# Patient Record
Sex: Female | Born: 1995 | Hispanic: Yes | Marital: Married | Smoking: Never smoker
Health system: Southern US, Community
[De-identification: ages and names within clinical notes are randomized; demographics above are authoritative.]

## PROBLEM LIST (undated history)

## (undated) ENCOUNTER — Inpatient Hospital Stay: Payer: Self-pay

---

## 2017-11-18 ENCOUNTER — Emergency Department: Payer: Medicaid Other

## 2017-11-18 ENCOUNTER — Emergency Department
Admission: EM | Admit: 2017-11-18 | Discharge: 2017-11-18 | Disposition: A | Discharge status: Home / Self Care | Payer: Medicaid Other | Attending: Emergency Medicine | Admitting: Emergency Medicine

## 2017-11-18 DIAGNOSIS — R1030 Lower abdominal pain, unspecified: Secondary | ICD-10-CM | POA: Insufficient documentation

## 2017-11-18 DIAGNOSIS — O9989 Other specified diseases and conditions complicating pregnancy, childbirth and the puerperium: Secondary | ICD-10-CM | POA: Diagnosis present

## 2017-11-18 DIAGNOSIS — R109 Unspecified abdominal pain: Secondary | ICD-10-CM

## 2017-11-18 DIAGNOSIS — O26891 Other specified pregnancy related conditions, first trimester: Secondary | ICD-10-CM

## 2017-11-18 LAB — COMPREHENSIVE METABOLIC PANEL
ALT: 18 U/L (ref 14–54)
ANION GAP: 7 (ref 5–15)
AST: 19 U/L (ref 15–41)
Albumin: 3.6 g/dL (ref 3.5–5.0)
Alkaline Phosphatase: 53 U/L (ref 38–126)
BILIRUBIN TOTAL: 0.3 mg/dL (ref 0.3–1.2)
BUN: 10 mg/dL (ref 6–20)
CHLORIDE: 108 mmol/L (ref 101–111)
CO2: 22 mmol/L (ref 22–32)
Calcium: 8.8 mg/dL — ABNORMAL LOW (ref 8.9–10.3)
Creatinine, Ser: 0.44 mg/dL (ref 0.44–1.00)
Glucose, Bld: 89 mg/dL (ref 65–99)
POTASSIUM: 3.3 mmol/L — AB (ref 3.5–5.1)
Sodium: 137 mmol/L (ref 135–145)
TOTAL PROTEIN: 7 g/dL (ref 6.5–8.1)

## 2017-11-18 LAB — URINALYSIS, COMPLETE (UACMP) WITH MICROSCOPIC
BACTERIA UA: NONE SEEN
Bilirubin Urine: NEGATIVE
Glucose, UA: NEGATIVE mg/dL
Hgb urine dipstick: NEGATIVE
KETONES UR: NEGATIVE mg/dL
LEUKOCYTES UA: NEGATIVE
Nitrite: NEGATIVE
PH: 5 (ref 5.0–8.0)
PROTEIN: NEGATIVE mg/dL
Specific Gravity, Urine: 1.015 (ref 1.005–1.030)

## 2017-11-18 LAB — CBC
HEMATOCRIT: 40.9 % (ref 35.0–47.0)
HEMOGLOBIN: 13.7 g/dL (ref 12.0–16.0)
MCH: 28.3 pg (ref 26.0–34.0)
MCHC: 33.5 g/dL (ref 32.0–36.0)
MCV: 84.5 fL (ref 80.0–100.0)
Platelets: 242 10*3/uL (ref 150–440)
RBC: 4.85 MIL/uL (ref 3.80–5.20)
RDW: 13.2 % (ref 11.5–14.5)
WBC: 11.8 10*3/uL — AB (ref 3.6–11.0)

## 2017-11-18 LAB — WET PREP, GENITAL
SPERM: NONE SEEN
TRICH WET PREP: NONE SEEN
YEAST WET PREP: NONE SEEN

## 2017-11-18 LAB — HCG, QUANTITATIVE, PREGNANCY: hCG, Beta Chain, Quant, S: 89515 m[IU]/mL — ABNORMAL HIGH (ref <5)

## 2017-11-18 LAB — ABO/RH: ABO/RH(D): A POS

## 2017-11-18 LAB — TROPONIN I: Troponin I: <0.03 ng/mL (ref <0.03)

## 2017-11-18 LAB — POCT PREGNANCY, URINE: PREG TEST UR: POSITIVE — AB

## 2017-11-18 LAB — LIPASE, BLOOD: LIPASE: 26 U/L (ref 11–51)

## 2017-11-18 MED ORDER — METRONIDAZOLE 0.75 % VA GEL
1.0000 | Freq: Every day | VAGINAL | Status: DC
  Filled 2017-11-18: qty 70

## 2017-11-18 MED ORDER — METRONIDAZOLE 0.75 % VA GEL: 1.0000 | Freq: Every day | VAGINAL | 0 refills | Status: AC

## 2017-11-18 NOTE — ED Provider Notes (Addendum)
[facility] Emergency Department Provider Note  ____________________________________________   I have reviewed the triage vital signs and the nursing notes. Where available I have reviewed prior notes and, if possible and indicated, outside hospital notes.    HISTORY  Chief Complaint Abdominal Pain    HPI Sonya Bruce is a 22 y.o. female   who is G2P1, states that she had a positive patency test today.  Patient states that she has had bilateral and suprapubic cramping discomfort for several weeks.  She states that it is sometimes worse and sometimes better but she cannot predict Y.  She denies to me any upper quadrant abdominal pain.  The pain is all suprapubic.  When she found she was pregnant she came in for further evaluation.  She denies any vaginal bleeding, she has a slight normal physiologic vaginal discharge she states.  She denies any diarrhea.  The pain seems to come and go.  She is not having it at this time.  She is concerned about the pregnancy.  She denies any fever chills, nausea or vomiting or any other concerns.  She has no dysuria, She is unclear on her dates, she has a regular.  She thinks she last had a period in January.   History reviewed. No pertinent past medical history.  There are no active problems to display for this patient.   History reviewed. No pertinent surgical history.  Prior to Admission medications   Not on File    Allergies Aspirin  No family history on file.  Social History Social History   Tobacco Use  . Smoking status: Never Smoker  . Smokeless tobacco: Never Used  Substance Use Topics  . Alcohol use: Not Currently  . Drug use: Not on file    Review of Systems Constitutional: No fever/chills Eyes: No visual changes. ENT: No sore throat. No stiff neck no neck pain Cardiovascular: Denies chest pain. Respiratory: Denies shortness of breath. Gastrointestinal:   no vomiting.  No diarrhea.  No  constipation. Genitourinary: Negative for dysuria. Musculoskeletal: Negative lower extremity swelling Skin: Negative for rash. Neurological: Negative for severe headaches, focal weakness or numbness.   ____________________________________________   PHYSICAL EXAM:  VITAL SIGNS: ED Triage Vitals  Enc Vitals Group     BP 11/18/17 1944 109/60     Pulse Rate 11/18/17 1944 83     Resp 11/18/17 1944 17     Temp 11/18/17 1944 98 F (36.7 C)     Temp Source 11/18/17 1944 Oral     SpO2 11/18/17 1944 100 %     Weight 11/18/17 1945 147 lb (66.7 kg)     Height 11/18/17 1945 5\' 3"  (1.6 m)     Head Circumference --      Peak Flow --      Pain Score 11/18/17 1948 8     Pain Loc --      Pain Edu? --      Excl. in GC? --     Constitutional: Alert and oriented. Well appearing and in no acute distress. Eyes: Conjunctivae are normal Head: Atraumatic HEENT: No congestion/rhinnorhea. Mucous membranes are moist.  Oropharynx non-erythematous Neck:   Nontender with no meningismus, no masses, no stridor Cardiovascular: Normal rate, regular rhythm. Grossly normal heart sounds.  Good peripheral circulation. Respiratory: Normal respiratory effort.  No retractions. Lungs CTAB. Abdominal: Soft and nontender. No distention. No guarding no rebound Back:  There is no focal tenderness or step off.  there is no midline  tenderness there are no lesions noted. there is no CVA tenderness Musculoskeletal: No lower extremity tenderness, no upper extremity tenderness. No joint effusions, no DVT signs strong distal pulses no edema Neurologic:  Normal speech and language. No gross focal neurologic deficits are appreciated.  Skin:  Skin is warm, dry and intact. No rash noted. Psychiatric: Mood and affect are normal. Speech and behavior are normal.  ____________________________________________   LABS (all labs ordered are listed, but only abnormal results are displayed)  Labs Reviewed  COMPREHENSIVE METABOLIC  PANEL - Abnormal; Notable for the following components:      Result Value   Potassium 3.3 (*)    Calcium 8.8 (*)    All other components within normal limits  CBC - Abnormal; Notable for the following components:   WBC 11.8 (*)    All other components within normal limits  URINALYSIS, COMPLETE (UACMP) WITH MICROSCOPIC - Abnormal; Notable for the following components:   Color, Urine YELLOW (*)    APPearance CLEAR (*)    Squamous Epithelial / LPF 0-5 (*)    All other components within normal limits  POCT PREGNANCY, URINE - Abnormal; Notable for the following components:   Preg Test, Ur POSITIVE (*)    All other components within normal limits  CHLAMYDIA/NGC RT PCR ([facility] ONLY)  WET PREP, GENITAL  LIPASE, BLOOD  TROPONIN I  HCG, QUANTITATIVE, PREGNANCY  POC URINE PREG, ED  ABO/RH    Pertinent labs  results that were available during my care of the patient were reviewed by me and considered in my medical decision making (see chart for details). ____________________________________________  EKG  I personally interpreted any EKGs ordered by me or triage Normal sinus rhythm rate 68 bpm no acute ST elevation or depression normal axis ____________________________________________  RADIOLOGY  Pertinent labs & imaging results that were available during my care of the patient were reviewed by me and considered in my medical decision making (see chart for details). If possible, patient and/or family made aware of any abnormal findings.  No results found. ____________________________________________    PROCEDURES  Procedure(s) performed: None  Procedures  Critical Care performed: None  ____________________________________________   INITIAL IMPRESSION / ASSESSMENT AND PLAN / ED COURSE  Pertinent labs & imaging results that were available during my care of the patient were reviewed by me and considered in my medical decision making (see chart for details).  Pregnant  patient here with unclear dates who presents with pelvic pain off and on for several weeks 2-3.  Abdomen is benign at this time will obtain an ultrasound to rule out ectopic I will do a pelvic exam to rule out PID and we will reassess.  I do not think she likely has appendicitis given that she has no fever she has had symptoms for weeks and she has no reducible right lower quadrant pain, there is nothing at this time to suggest gallbladder disease she has no tenderness to palpation in the right upper quadrant, I do not think CT is indicated given her pregnancy state and lack of significant findings on exam or blood work.  If ultrasound is negative, is my hope we can get her safely home with close outpatient follow-up with OB  ----------------------------------------- 10:51 PM on 11/18/2017 -----------------------------------------   Pelvic exam: Female nurse chaperone present, no external lesions noted, physiologic vaginal discharge noted with no purulent discharge, no cervical motion tenderness, no adnexal tenderness or mass, there is mild uterine tenderness which reproduces her discomfort, uterus is not  markedly hypertrophied.. No vaginal bleeding She remains very comfortable in the emergency room with serial exams quite reassuring, ultrasound is negative is my hope that we can get her home.  Considering the patient's symptoms, medical history, and physical examination today, I have low suspicion for cholecystitis or biliary pathology, pancreatitis, perforation or bowel obstruction, hernia, intra-abdominal abscess, AAA or dissection, volvulus or intussusception, mesenteric ischemia, ischemic gut, PID pyelonephritis or appendicitis.  Serial abdominal exams are completely benign.  I asked her to start prenatal vitamins, return precautions given as to most likely this is round ligament pain is been going on for weeks, and very reassuring workup.  She is Rh+, reassuring workup, occasional clue cells noted  but I do not think there is minimal clinical indication that she has BV at this time.  We will treat topically pending OB visit   ____________________________________________   FINAL CLINICAL IMPRESSION(S) / ED DIAGNOSES  Final diagnoses:  None      This chart was dictated using voice recognition software.  Despite best efforts to proofread,  errors can occur which can change meaning.      Jeanmarie PlantMcShane, James A, MD 11/18/17 2129    Jeanmarie PlantMcShane, James A, MD 11/18/17 2252    Jeanmarie PlantMcShane, James A, MD 11/18/17 [id number]    Jeanmarie PlantMcShane, James A, MD 11/18/17 [id number]    Jeanmarie PlantMcShane, James A, MD 11/30/17 1108

## 2017-11-18 NOTE — ED Triage Notes (Signed)
Patient c/o URQ abdominal pain X 1 week. Patient also reports positive pregnancy test at home. Patient denies nausea, vaginal discharge, vaginal bleeding.

## 2017-11-18 NOTE — ED Notes (Signed)
Pt returned from U/S via stretcher. 

## 2017-11-18 NOTE — ED Notes (Signed)
Pt states that she has right sided flank pain that comes and goes. Feels "like a knife" is cutting her. Sometimes its one side and sometimes its the other side. Pt is alert and oriented x4 and only spanish speaking. Her husband speaks [place] fluently. Family at bedside.

## 2017-11-19 LAB — CHLAMYDIA/NGC RT PCR (ARMC ONLY)
Chlamydia Tr: NOT DETECTED
N gonorrhoeae: NOT DETECTED

## 2018-01-27 ENCOUNTER — Other Ambulatory Visit: Payer: Self-pay | Admitting: Family Medicine

## 2018-01-27 DIAGNOSIS — Z3482 Encounter for supervision of other normal pregnancy, second trimester: Secondary | ICD-10-CM

## 2018-02-01 ENCOUNTER — Ambulatory Visit
Admission: RE | Admit: 2018-02-01 | Discharge: 2018-02-01 | Disposition: A | Discharge status: Home / Self Care | Payer: Medicaid Other | Source: Ambulatory Visit | Attending: Family Medicine | Admitting: Family Medicine

## 2018-02-01 DIAGNOSIS — Z3A21 21 weeks gestation of pregnancy: Secondary | ICD-10-CM | POA: Insufficient documentation

## 2018-02-01 DIAGNOSIS — Z3482 Encounter for supervision of other normal pregnancy, second trimester: Secondary | ICD-10-CM | POA: Insufficient documentation

## 2018-04-26 ENCOUNTER — Observation Stay
Admission: EM | Admit: 2018-04-26 | Discharge: 2018-04-26 | Disposition: A | Discharge status: Home / Self Care | Payer: Medicaid Other | Attending: Obstetrics & Gynecology | Admitting: Obstetrics & Gynecology

## 2018-04-26 ENCOUNTER — Other Ambulatory Visit: Payer: Self-pay

## 2018-04-26 DIAGNOSIS — Z3A33 33 weeks gestation of pregnancy: Secondary | ICD-10-CM | POA: Diagnosis not present

## 2018-04-26 DIAGNOSIS — Z3493 Encounter for supervision of normal pregnancy, unspecified, third trimester: Secondary | ICD-10-CM | POA: Diagnosis not present

## 2018-04-26 NOTE — OB Triage Note (Signed)
Pt G2P1 presents at 385w1d with complaints of CTX that started 2-3 days ago. States they are 30min apart and has only had 2-3 today. Reports LOF that is clear and thick that started 2 days ago. Reports +FM. nitrazine -. Reports having sex yesterday around  Midnight.  [facility] but missed her appt today. No prenatal records available. Used interpreter at bedside to triage. Pt reports no other complaints. Pt states she has not had a contraction since she arrived.  Monitors applied. VSS.   Reported all info above to Dr. Elesa MassedWard MD. Ward gives verbal order to d/c patient home and follow up with appt at [facility]. Shared POC with oncoming nurse Sam.

## 2018-04-30 NOTE — Discharge Summary (Addendum)
Patient's last menstrual period was 09/30/2017. - estimated EDC: Estimated Date of Delivery: 06/09/18 based on 11w ultrasound EGA: 33w+3d Patient has a different due date that she states was based on a 21 week ultrasound.  Since we have record of an 11 week scan, than that is more precise and should be used for dating purposes.  Patient presented for evaluation of labor.  Patient had cervical exam by RN and this was reported to me. I reviewed her vital signs and fetal tracing, both of which were reassuring.  Patient was discharged as she was not laboring.  NST interpretation: Reactive.  Ranae Plumberhelsea Ward, MD Attending Obstetrician and Gynecologist [facility]

## 2018-09-01 ENCOUNTER — Encounter (HOSPITAL_COMMUNITY): Payer: Self-pay

## 2020-06-18 IMAGING — US US OB COMP +14 WK
1 series · 13 of 28 positions shown · non-contrast
Comparison: none

ADDENDUM:
In the Findings section below, the US EDC based on today's
ultrasound measurements contains a voice recognition error, and
should read 06/12/2018 rather than 06/20/2018. This does not change
the patient's assigned gestational age which is currently 21 weeks 5
days (EDC 06/09/2018) as noted in the clinical data and impression
sections, and based on the prior ultrasound of 11/18/2017. Today's
exam shows appropriate fetal growth. This was discussed by telephone
with Dr. Bobanek at the time of this addendum.
CLINICAL DATA: Current assigned gestational age of 21 weeks 5 days
by prior ultrasound. Evaluate fetal growth and anatomy.

EXAM:
OBSTETRICAL ULTRASOUND >14 WKS

[Series 1: us ob comp +14 wk · 0.23mm/px · 13 of 55 slices shown]
[im 3/55]
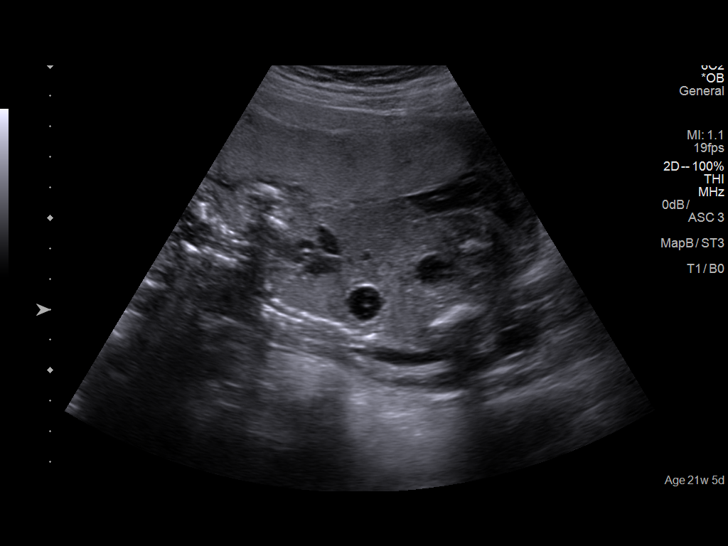
[im 7/55]
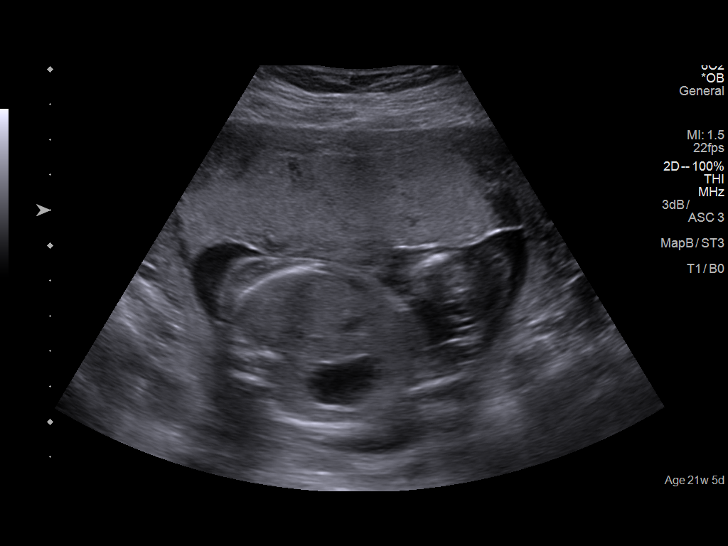
[im 11/55]
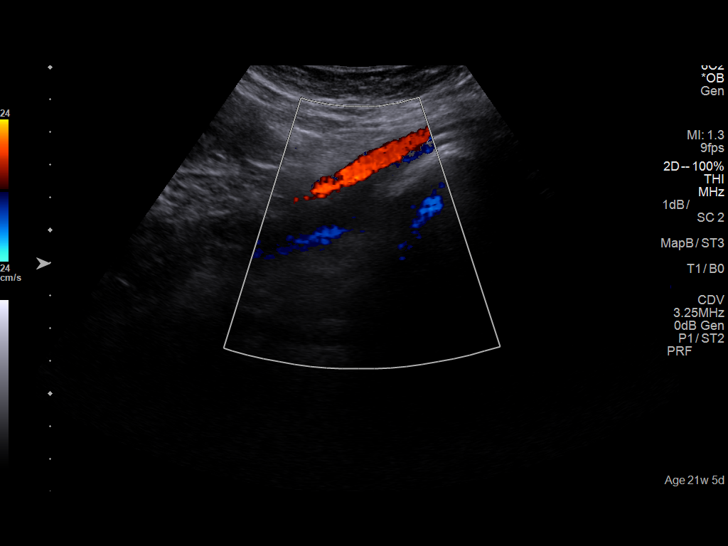
[im 15/55]
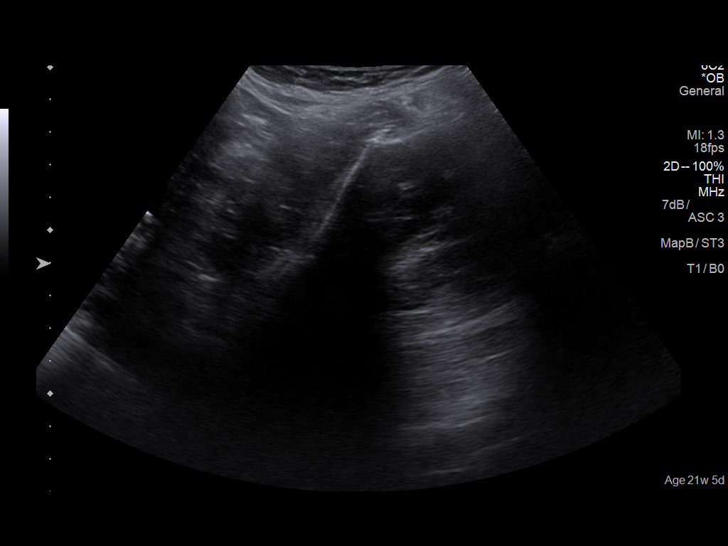
[im 19/55]
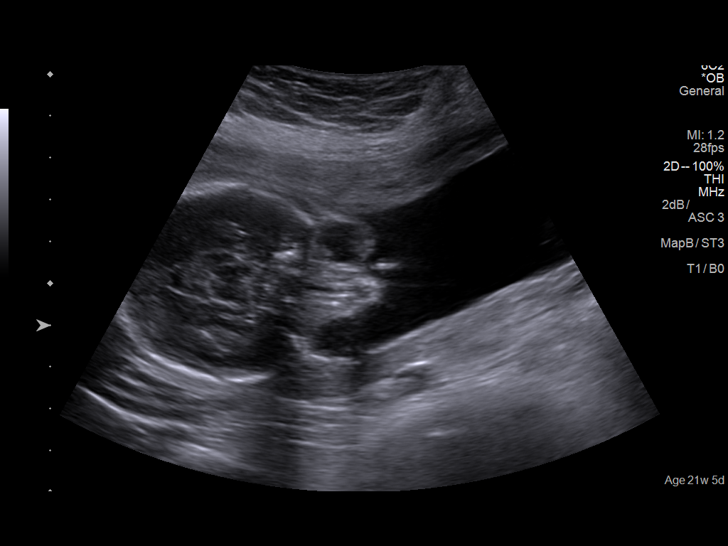
[im 23/55]
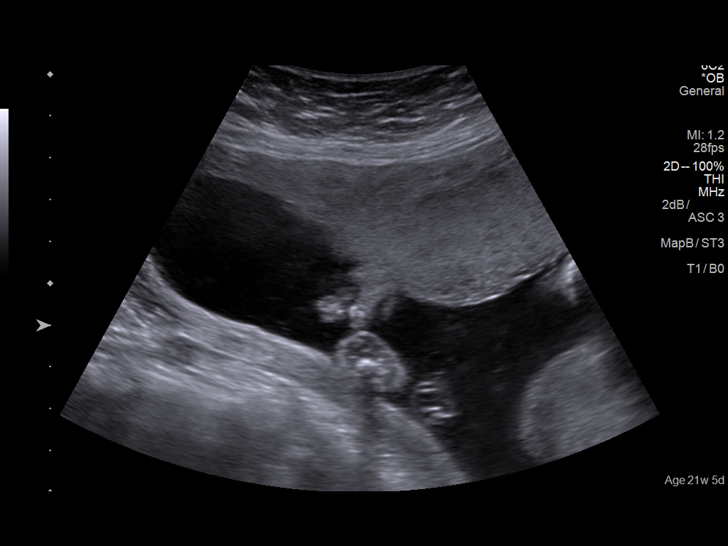
[im 29/55]
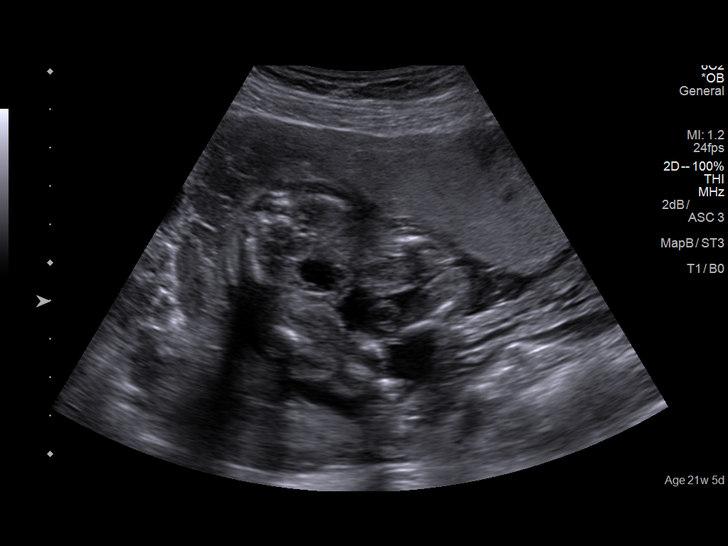
[im 33/55]
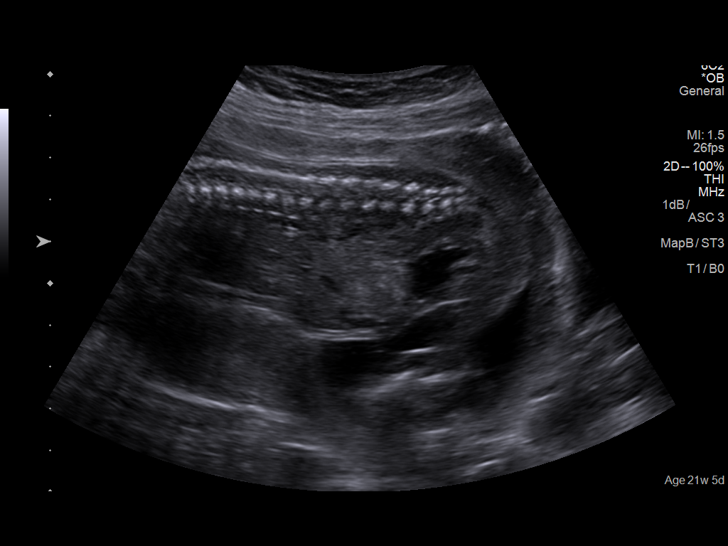
[im 37/55]
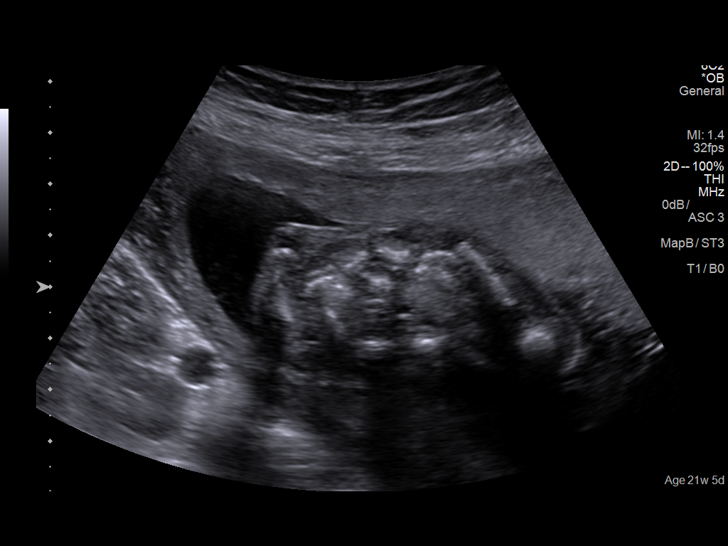
[im 41/55]
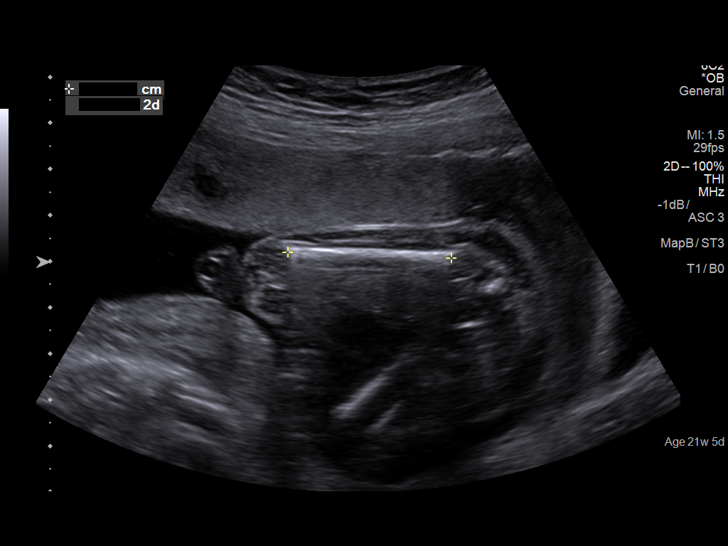
[im 45/55]
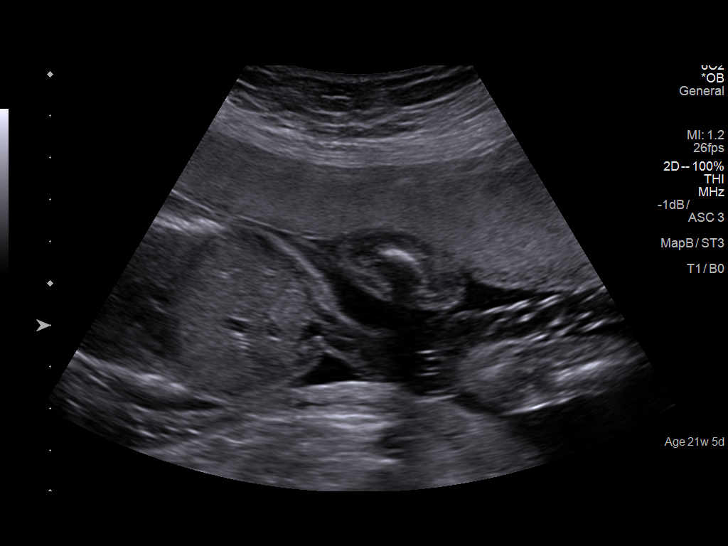
[im 49/55]
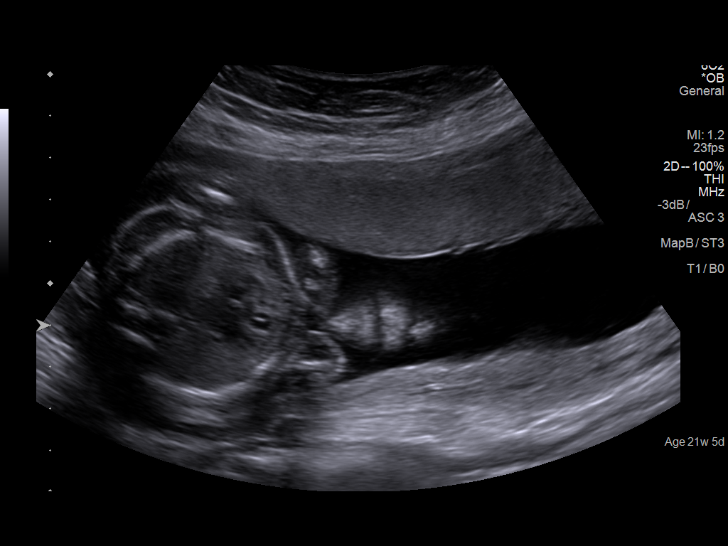
[im 53/55]
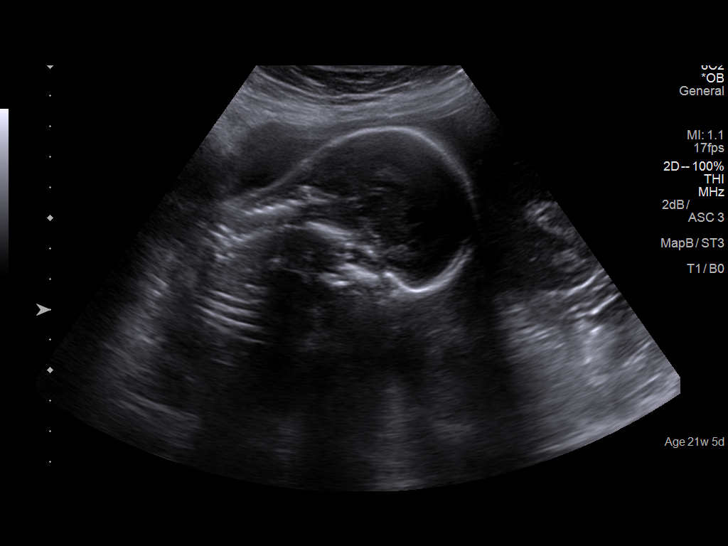

[13 of 28 positions shown; findings below may reference images not displayed]

FINDINGS: Number of Fetuses: 1

Heart Rate:  150 bpm

Movement: Yes

Presentation: Breech

Previa: No

Placental Location: Anterior

Amniotic Fluid (Subjective): Within normal limits

Amniotic Fluid (Objective):

Vertical pocket 4.9cm

FETAL BIOMETRY

BPD:  4.9cm 20w 5d

HC:    18.7cm 21w 0d

AC:   15.9cm 21w 0d

FL:   3.6cm 21w 2d

Current Mean GA: 21w 2d US EDC: 06/20/2018

FETAL ANATOMY

Lateral Ventricles: Appears normal

Thalami/CSP: Appears normal

Posterior Fossa:  Appears normal

Nuchal Region: Appears normal

Upper Lip: Appears normal

Spine: Appears normal

4 Chamber Heart on Left: Appears normal

LVOT: Appears normal

RVOT: Appears normal

Stomach on Left: Appears normal

3 Vessel Cord: Appears normal

Cord Insertion site: Appears normal

Kidneys: Appears normal

Bladder: Appears normal

Extremities: Appears normal

Maternal Findings:

Cervix:  4.0 cm TA
IMPRESSION: Assigned gestational age is currently 21 weeks 5 days. Appropriate
fetal growth.

No fetal anomalies seen involving visualized anatomy.

## 2023-07-03 ENCOUNTER — Emergency Department
Admission: EM | Admit: 2023-07-03 | Discharge: 2023-07-03 | Disposition: A | Discharge status: Home / Self Care | Payer: Self-pay | Attending: Emergency Medicine | Admitting: Emergency Medicine

## 2023-07-03 ENCOUNTER — Other Ambulatory Visit: Payer: Self-pay

## 2023-07-03 ENCOUNTER — Emergency Department: Payer: Medicaid Other

## 2023-07-03 DIAGNOSIS — N61 Mastitis without abscess: Secondary | ICD-10-CM | POA: Insufficient documentation

## 2023-07-03 LAB — BASIC METABOLIC PANEL
Anion gap: 8 (ref 5–15)
BUN: 15 mg/dL (ref 6–20)
CO2: 23 mmol/L (ref 22–32)
Calcium: 8.7 mg/dL — ABNORMAL LOW (ref 8.9–10.3)
Chloride: 107 mmol/L (ref 98–111)
Creatinine, Ser: 0.71 mg/dL (ref 0.44–1.00)
GFR, Estimated: >60 mL/min (ref >60)
Glucose, Bld: 105 mg/dL — ABNORMAL HIGH (ref 70–99)
Potassium: 3.6 mmol/L (ref 3.5–5.1)
Sodium: 138 mmol/L (ref 135–145)

## 2023-07-03 LAB — CBC WITH DIFFERENTIAL/PLATELET
Abs Immature Granulocytes: 0.04 10*3/uL (ref 0.00–0.07)
Basophils Absolute: 0 10*3/uL (ref 0.0–0.1)
Basophils Relative: 0 %
Eosinophils Absolute: 0.1 10*3/uL (ref 0.0–0.5)
Eosinophils Relative: 1 %
HCT: 40.9 % (ref 36.0–46.0)
Hemoglobin: 13.9 g/dL (ref 12.0–15.0)
Immature Granulocytes: 0 %
Lymphocytes Relative: 24 %
Lymphs Abs: 2.6 10*3/uL (ref 0.7–4.0)
MCH: 28.8 pg (ref 26.0–34.0)
MCHC: 34 g/dL (ref 30.0–36.0)
MCV: 84.7 fL (ref 80.0–100.0)
Monocytes Absolute: 0.9 10*3/uL (ref 0.1–1.0)
Monocytes Relative: 8 %
Neutro Abs: 7.2 10*3/uL (ref 1.7–7.7)
Neutrophils Relative %: 67 %
Platelets: 242 10*3/uL (ref 150–400)
RBC: 4.83 MIL/uL (ref 3.87–5.11)
RDW: 12.3 % (ref 11.5–15.5)
WBC: 10.8 10*3/uL — ABNORMAL HIGH (ref 4.0–10.5)
nRBC: 0 % (ref 0.0–0.2)

## 2023-07-03 LAB — POC URINE PREG, ED: Preg Test, Ur: NEGATIVE

## 2023-07-03 MED ORDER — CLINDAMYCIN HCL 150 MG PO CAPS
300.0000 mg | ORAL_CAPSULE | Freq: Once | ORAL | Status: AC
  Administered 2023-07-03: 300 mg via ORAL
  Filled 2023-07-03: qty 2

## 2023-07-03 MED ORDER — CLINDAMYCIN HCL 300 MG PO CAPS: 300.0000 mg | ORAL_CAPSULE | Freq: Three times a day (TID) | ORAL | 0 refills | Status: AC

## 2023-07-03 MED ORDER — OXYCODONE-ACETAMINOPHEN 5-325 MG PO TABS: 1.0000 | ORAL_TABLET | Freq: Three times a day (TID) | ORAL | 0 refills | Status: AC | PRN

## 2023-07-03 NOTE — ED Triage Notes (Addendum)
Pt to ed from home via POV with spouse for breast pain for years. Pt is caoX4, in no acute distress and ambulatory in triage. Pt has redness and heat coming from both breasts. Pt does have HX of breast cancer in family. Pt denies any discharge and her nipples are not inverted. Pt is not currently breast feeding either.

## 2023-07-03 NOTE — Discharge Instructions (Addendum)
Your exam and labs are overall reassuring.  Your ultrasound does not show evidence of an abscess.  You are being treated for a cellulitis which is inflammation/infection in the tissue of the breast.  Take the antibiotic 3 times daily as directed.  Take the prescription pain medicine as needed.  You should consider taking a probiotic to help reduce stomach irritation.  You may also need to do an over-the-counter yeast infection treatment as this antibiotic can sometimes cause vaginal yeast infection.  Apply warm compresses to help reduce the pain.  Follow-up with your primary provider for ongoing evaluation.  Consider evaluation at the [facility] breast center for ongoing concerns.  Su examen y sus laboratorios son en general tranquilizadores.  Su ultrasonido no muestra evidencia de un absceso.  Est siendo tratada por celulitis, que es una inflamacin/infeccin en el tejido de la mama.  Tome el antibitico 3 veces al da segn las indicaciones.  Tome el analgsico recetado segn sea necesario.  Debera considerar tomar un probitico para ayudar a Art therapist.  Es posible que tambin deba realizar un tratamiento para la candidiasis vaginal de venta libre, ya que este antibitico a veces puede causar candidiasis vaginal.  Aplique compresas tibias para ayudar a reducir Chief Technology Officer.  Haga un seguimiento con su proveedor principal para una evaluacin continua.  Considere la posibilidad de Development worker, community en el centro de mama de [facility] si tiene inquietudes persistentes.

## 2023-07-03 NOTE — ED Provider Notes (Signed)
    [facility] Emergency Department Provider Note     Event Date/Time   First MD Initiated Contact with Patient 07/03/23 1923     (approximate)   History   Breast Pain (bilateral)   HPI  Sonya Bruce is a 27 y.o. female ***  L>R breast pain - electrical Left scap pain Left nipple discharge - milky No FCS, NVD No breast feeding    Physical Exam   Triage Vital Signs: ED Triage Vitals  Encounter Vitals Group     BP 07/03/23 1847 126/89     Systolic BP Percentile --      Diastolic BP Percentile --      Pulse Rate 07/03/23 1847 95     Resp 07/03/23 1847 20     Temp 07/03/23 1847 98.3 F (36.8 C)     Temp Source 07/03/23 1847 Oral     SpO2 07/03/23 1847 100 %     Weight --      Height 07/03/23 1850 5\' 3"  (1.6 m)     Head Circumference --      Peak Flow --      Pain Score 07/03/23 1849 10     Pain Loc --      Pain Education --      Exclude from Growth Chart --     Most recent vital signs: Vitals:   07/03/23 1847  BP: 126/89  Pulse: 95  Resp: 20  Temp: 98.3 F (36.8 C)  SpO2: 100%    General Awake, no distress. *** {**HEENT NCAT. PERRL. EOMI. No rhinorrhea. Mucous membranes are moist. **} CV:  Good peripheral perfusion. *** RESP:  Normal effort. *** ABD:  No distention. *** {**Other: **}   ED Results / Procedures / Treatments   Labs (all labs ordered are listed, but only abnormal results are displayed) Labs Reviewed - No data to display   EKG  ***  RADIOLOGY  {**I personally viewed and evaluated these images as part of my medical decision making, as well as reviewing the written report by the radiologist.  ED Provider Interpretation: ***  No results found.   PROCEDURES:  Critical Care performed: {CriticalCareYesNo:19197::"Yes, see critical care procedure note(s)","No"}  Procedures   MEDICATIONS ORDERED IN ED: Medications - No data to display   IMPRESSION / MDM / ASSESSMENT AND PLAN / ED COURSE   I reviewed the triage vital signs and the nursing notes.                              Differential diagnosis includes, but is not limited to, ***  Patient's presentation is most consistent with {EM COPA:27473}  {**The patient is on the cardiac monitor to evaluate for evidence of arrhythmia and/or significant heart rate changes.**}  Patient's diagnosis is consistent with ***. Patient will be discharged home with prescriptions for ***. Patient is to follow up with *** as needed or otherwise directed. Patient is given ED precautions to return to the ED for any worsening or new symptoms.     FINAL CLINICAL IMPRESSION(S) / ED DIAGNOSES   Final diagnoses:  None     Rx / DC Orders   ED Discharge Orders     None        Note:  This document was prepared using Dragon voice recognition software and may include unintentional dictation errors.

## 2023-08-25 ENCOUNTER — Emergency Department
Admission: EM | Admit: 2023-08-25 | Discharge: 2023-08-25 | Disposition: A | Discharge status: Home / Self Care | Payer: Self-pay | Attending: Emergency Medicine | Admitting: Emergency Medicine

## 2023-08-25 ENCOUNTER — Other Ambulatory Visit: Payer: Self-pay

## 2023-08-25 DIAGNOSIS — R0602 Shortness of breath: Secondary | ICD-10-CM | POA: Insufficient documentation

## 2023-08-25 DIAGNOSIS — J020 Streptococcal pharyngitis: Secondary | ICD-10-CM | POA: Insufficient documentation

## 2023-08-25 LAB — GROUP A STREP BY PCR: Group A Strep by PCR: DETECTED — AB

## 2023-08-25 MED ORDER — AMOXICILLIN 500 MG PO CAPS: 500.0000 mg | ORAL_CAPSULE | Freq: Three times a day (TID) | ORAL | 0 refills | Status: AC

## 2023-08-25 MED ORDER — CEFTRIAXONE SODIUM 1 G IJ SOLR
1.0000 g | Freq: Once | INTRAMUSCULAR | Status: AC
  Administered 2023-08-25: 1 g via INTRAMUSCULAR
  Filled 2023-08-25 (×2): qty 10

## 2023-08-25 NOTE — Discharge Instructions (Signed)
 Follow-up with your primary care provider if any continued problems.  Tylenol or ibuprofen as needed for throat pain or fever.  Increase fluids.  Take all medication as directed.

## 2023-08-25 NOTE — ED Triage Notes (Signed)
 Pt to ED for sore throat, states feels like throat swollen. Visualized swallowing saliva. No swelling noted to lips, tongue.  Daughter here for similar.

## 2023-08-25 NOTE — ED Provider Notes (Signed)
 [facility] Provider Note    Event Date/Time   First MD Initiated Contact with Patient 08/25/23 1242     (approximate)   History   Shortness of Breath   HPI  Sonya Bruce is a 28 y.o. female   presents to the ED with complaint of sore throat that has become more painful each day.  Patient states occasionally she has to spit her saliva out as it is painful to swallow.  Patient denies any difficulty with swelling to her lips or tongue.  Has been considered possibility that this was due to allergies and gave her Benadryl without any relief.  Patient is unaware of any fever.  She and her husband both are here with their 59-year-old daughter who became sick initially.      Physical Exam   Triage Vital Signs: ED Triage Vitals [08/25/23 1042]  Encounter Vitals Group     BP (!) 122/92     Systolic BP Percentile      Diastolic BP Percentile      Pulse Rate (!) 103     Resp 20     Temp 98.9 F (37.2 C)     Temp src      SpO2 99 %     Weight 145 lb (65.8 kg)     Height 5' 3 (1.6 m)     Head Circumference      Peak Flow      Pain Score 8     Pain Loc      Pain Education      Exclude from Growth Chart     Most recent vital signs: Vitals:   08/25/23 1042  BP: (!) 122/92  Pulse: (!) 103  Resp: 20  Temp: 98.9 F (37.2 C)  SpO2: 99%     General: Awake, no distress.  Alert, cooperative.  Noted occasionally to be spitting saliva however she is able to maintain secretions during exam. CV:  Good peripheral perfusion.  Heart regular rate rhythm. Resp:  Normal effort.  Lungs are clear bilaterally. Abd:  No distention.  Other:  Posterior pharynx with bilateral tonsillar enlargement, cryptic tonsils and mild exudates present.  Uvula is midline.  Neck is supple with mild cervical lymphadenopathy.  Patient occasionally answers questions and whispers due to throat pain.   ED Results / Procedures / Treatments   Labs (all labs ordered are listed,  but only abnormal results are displayed) Labs Reviewed  GROUP A STREP BY PCR - Abnormal; Notable for the following components:      Result Value   Group A Strep by PCR DETECTED (*)    All other components within normal limits     PROCEDURES:  Critical Care performed:   Procedures   MEDICATIONS ORDERED IN ED: Medications  cefTRIAXone  (ROCEPHIN ) injection 1 g (1 g Intramuscular Given 08/25/23 1316)     IMPRESSION / MDM / ASSESSMENT AND PLAN / ED COURSE  I reviewed the triage vital signs and the nursing notes.   Differential diagnosis includes, but is not limited to, viral pharyngitis, strep pharyngitis, tonsillitis, peritonsillar abscess, peritonsillar cellulitis.  28 year old female presents to the ED with complaint of sore throat since yesterday.  Patient reports that is difficult to swallow secondary to increased pain with swallowing.  Patient has been made aware that she does have a positive strep test and that she is contagious.  Because she is having increased pain and is reluctant to swallow any pills an injection of  Rocephin  was given to her while in the emergency department.  A prescription for amoxicillin  500 mg 3 times daily for 10 days was sent to the pharmacy and husband is aware that Benadryl will not help with her symptoms but that Tylenol  or ibuprofen would help with her throat pain.  She is strongly encouraged to increase fluids and eat soft foods until some of her symptoms are improving.  She is return to the emergency department if any severe worsening of her symptoms.      Patient's presentation is most consistent with acute complicated illness / injury requiring diagnostic workup.  FINAL CLINICAL IMPRESSION(S) / ED DIAGNOSES   Final diagnoses:  Strep pharyngitis     Rx / DC Orders   ED Discharge Orders          Ordered    amoxicillin  (AMOXIL ) 500 MG capsule  3 times daily        08/25/23 1258             Note:  This document was prepared  using Dragon voice recognition software and may include unintentional dictation errors.   Saunders Shona CROME, PA-C 08/25/23 1414    Arlander Charleston, MD 08/25/23 [phone]

## 2023-08-25 NOTE — ED Provider Triage Note (Signed)
 Emergency Medicine Provider Triage Evaluation Note  Sonya Bruce , a 28 y.o. female  was evaluated in triage.  Pt complains of throat pain and problems swallowing. Took some medication thinking that it was allergy.    Review of Systems  Positive: Throat pain, difficulty swallowing salvia due to pain.   Negative: No fever, chills.   Physical Exam  BP (!) 122/92   Pulse (!) 103   Temp 98.9 F (37.2 C)   Resp 20   Ht 5' 3 (1.6 m)   Wt 65.8 kg   LMP 07/01/2023   SpO2 99%   BMI 25.69 kg/m  Gen:   Awake, no distress   Resp:  Normal effort  MSK:   Moves extremities without difficulty  Other:  Enlarged tonsil bilaterally, appears cryptic. Neck supple but tender to light palpation.   Medical Decision Making  Medically screening exam initiated at 10:48 AM.  Appropriate orders placed.  Finesse Fielder was informed that the remainder of the evaluation will be completed by another provider, this initial triage assessment does not replace that evaluation, and the importance of remaining in the ED until their evaluation is complete.     Saunders Shona CROME, PA-C 08/25/23 1052

## 2024-04-20 DIAGNOSIS — Z3A01 Less than 8 weeks gestation of pregnancy: Secondary | ICD-10-CM | POA: Diagnosis not present

## 2024-04-20 DIAGNOSIS — Z3687 Encounter for antenatal screening for uncertain dates: Secondary | ICD-10-CM | POA: Diagnosis not present

## 2024-04-20 DIAGNOSIS — Z369 Encounter for antenatal screening, unspecified: Secondary | ICD-10-CM | POA: Diagnosis not present

## 2024-04-20 DIAGNOSIS — Z3481 Encounter for supervision of other normal pregnancy, first trimester: Secondary | ICD-10-CM | POA: Diagnosis not present

## 2024-05-25 DIAGNOSIS — Z124 Encounter for screening for malignant neoplasm of cervix: Secondary | ICD-10-CM | POA: Diagnosis not present

## 2024-05-25 DIAGNOSIS — B002 Herpesviral gingivostomatitis and pharyngotonsillitis: Secondary | ICD-10-CM | POA: Diagnosis not present

## 2024-05-25 DIAGNOSIS — Z113 Encounter for screening for infections with a predominantly sexual mode of transmission: Secondary | ICD-10-CM | POA: Diagnosis not present

## 2024-07-26 DIAGNOSIS — Z3A21 21 weeks gestation of pregnancy: Secondary | ICD-10-CM | POA: Diagnosis not present

## 2024-07-26 DIAGNOSIS — Z3482 Encounter for supervision of other normal pregnancy, second trimester: Secondary | ICD-10-CM | POA: Diagnosis not present

## 2024-07-26 DIAGNOSIS — Z363 Encounter for antenatal screening for malformations: Secondary | ICD-10-CM | POA: Diagnosis not present
